# Patient Record
Sex: Female | Born: 1960 | Race: Black or African American | Hispanic: Refuse to answer | Marital: Single
Health system: Southern US, Community
[De-identification: ages and names within clinical notes are randomized; demographics above are authoritative.]

## PROBLEM LIST (undated history)

## (undated) HISTORY — PX: BREAST EXCISIONAL BIOPSY: SUR124

## (undated) HISTORY — PX: BREAST CYST ASPIRATION: SHX578

---

## 2019-05-28 ENCOUNTER — Other Ambulatory Visit: Payer: Self-pay | Admitting: Family Medicine

## 2019-05-28 ENCOUNTER — Other Ambulatory Visit: Payer: Self-pay

## 2019-05-28 ENCOUNTER — Ambulatory Visit
Admission: RE | Admit: 2019-05-28 | Discharge: 2019-05-28 | Disposition: A | Discharge status: Home / Self Care | Payer: Commercial Managed Care - PPO | Source: Ambulatory Visit | Attending: Family Medicine | Admitting: Family Medicine

## 2019-05-28 DIAGNOSIS — Z1231 Encounter for screening mammogram for malignant neoplasm of breast: Secondary | ICD-10-CM

## 2019-06-20 ENCOUNTER — Other Ambulatory Visit: Payer: Self-pay | Admitting: Family Medicine

## 2019-06-20 DIAGNOSIS — R928 Other abnormal and inconclusive findings on diagnostic imaging of breast: Secondary | ICD-10-CM

## 2019-07-11 ENCOUNTER — Other Ambulatory Visit: Payer: Self-pay

## 2019-07-11 ENCOUNTER — Ambulatory Visit
Admission: RE | Admit: 2019-07-11 | Discharge: 2019-07-11 | Disposition: A | Discharge status: Home / Self Care | Payer: Commercial Managed Care - PPO | Source: Ambulatory Visit | Attending: Family Medicine | Admitting: Family Medicine

## 2019-07-11 DIAGNOSIS — R928 Other abnormal and inconclusive findings on diagnostic imaging of breast: Secondary | ICD-10-CM

## 2020-10-03 ENCOUNTER — Ambulatory Visit: Payer: Commercial Managed Care - PPO | Attending: Internal Medicine

## 2020-10-03 DIAGNOSIS — Z23 Encounter for immunization: Secondary | ICD-10-CM

## 2020-10-03 NOTE — Progress Notes (Signed)
° °  Covid-19 Vaccination Clinic  Name:  Pressley Barsky    MRN: [id number] DOB: [birth year]  10/03/2020  Ms. Gervin was observed post Covid-19 immunization for 30 minutes based on pre-vaccination screening without incident. She was provided with Vaccine Information Sheet and instruction to access the V-Safe system.   Ms. Capurro was instructed to call 911 with any severe reactions post vaccine:  Difficulty breathing   Swelling of face and throat   A fast heartbeat   A bad rash all over body   Dizziness and weakness   Immunizations Administered    Name Date Dose VIS Date Route   Moderna COVID-19 Vaccine 10/03/2020  1:47 PM 0.5 mL 08/20/2020 Intramuscular   Manufacturer: Moderna   Lot: [id number]   NDC: 29518-841-66

## 2020-11-07 ENCOUNTER — Ambulatory Visit: Payer: Commercial Managed Care - PPO

## 2020-11-21 ENCOUNTER — Other Ambulatory Visit: Payer: Self-pay | Admitting: Family Medicine

## 2020-11-21 DIAGNOSIS — N63 Unspecified lump in unspecified breast: Secondary | ICD-10-CM

## 2020-11-21 DIAGNOSIS — N6019 Diffuse cystic mastopathy of unspecified breast: Secondary | ICD-10-CM

## 2020-12-12 ENCOUNTER — Other Ambulatory Visit: Payer: Self-pay | Admitting: Family Medicine

## 2020-12-12 DIAGNOSIS — Z1231 Encounter for screening mammogram for malignant neoplasm of breast: Secondary | ICD-10-CM

## 2020-12-15 ENCOUNTER — Other Ambulatory Visit: Payer: Self-pay

## 2020-12-15 ENCOUNTER — Ambulatory Visit
Admission: RE | Admit: 2020-12-15 | Discharge: 2020-12-15 | Disposition: A | Discharge status: Home / Self Care | Payer: Commercial Managed Care - PPO | Source: Ambulatory Visit | Attending: Family Medicine | Admitting: Family Medicine

## 2020-12-15 DIAGNOSIS — Z1231 Encounter for screening mammogram for malignant neoplasm of breast: Secondary | ICD-10-CM

## 2021-02-02 ENCOUNTER — Ambulatory Visit: Payer: Commercial Managed Care - PPO

## 2021-06-23 ENCOUNTER — Other Ambulatory Visit: Payer: Self-pay | Admitting: Family Medicine

## 2021-06-23 DIAGNOSIS — R2241 Localized swelling, mass and lump, right lower limb: Secondary | ICD-10-CM

## 2021-06-23 DIAGNOSIS — R222 Localized swelling, mass and lump, trunk: Secondary | ICD-10-CM

## 2021-06-29 ENCOUNTER — Other Ambulatory Visit: Payer: Commercial Managed Care - PPO

## 2021-07-13 ENCOUNTER — Ambulatory Visit
Admission: RE | Admit: 2021-07-13 | Discharge: 2021-07-13 | Disposition: A | Discharge status: Home / Self Care | Payer: Commercial Managed Care - PPO | Source: Ambulatory Visit | Attending: Family Medicine | Admitting: Family Medicine

## 2021-07-13 DIAGNOSIS — R2241 Localized swelling, mass and lump, right lower limb: Secondary | ICD-10-CM

## 2021-07-13 DIAGNOSIS — R222 Localized swelling, mass and lump, trunk: Secondary | ICD-10-CM

## 2022-02-12 ENCOUNTER — Other Ambulatory Visit: Payer: Self-pay | Admitting: Family Medicine

## 2022-02-12 DIAGNOSIS — Z1231 Encounter for screening mammogram for malignant neoplasm of breast: Secondary | ICD-10-CM

## 2022-02-18 ENCOUNTER — Other Ambulatory Visit: Payer: Self-pay | Admitting: Family Medicine

## 2022-02-18 DIAGNOSIS — N6452 Nipple discharge: Secondary | ICD-10-CM

## 2022-02-18 DIAGNOSIS — N6012 Diffuse cystic mastopathy of left breast: Secondary | ICD-10-CM

## 2022-03-08 ENCOUNTER — Ambulatory Visit
Admission: RE | Admit: 2022-03-08 | Discharge: 2022-03-08 | Disposition: A | Discharge status: Home / Self Care | Payer: Commercial Managed Care - PPO | Source: Ambulatory Visit | Attending: Family Medicine | Admitting: Family Medicine

## 2022-03-08 DIAGNOSIS — N6452 Nipple discharge: Secondary | ICD-10-CM

## 2022-03-08 DIAGNOSIS — N6012 Diffuse cystic mastopathy of left breast: Secondary | ICD-10-CM

## 2023-12-24 IMAGING — MG DIGITAL DIAGNOSTIC BILAT W/ TOMO W/ CAD
5 of 10 series · 5 of 30 positions shown · non-contrast
Comparison: Previous exam(s).
COMPARISON: Previous exam(s).

Addendum:
CLINICAL DATA: Patient describes previous abscess drainage within
the inner periareolar LEFT breast. Patient complains of increasingly
foul smell at the site of this previous drainage. However, patient
denies new palpable lump or any current drainage.

EXAM:
DIGITAL DIAGNOSTIC BILATERAL MAMMOGRAM WITH TOMOSYNTHESIS AND CAD;
ULTRASOUND LEFT BREAST LIMITED
TECHNIQUE: Bilateral digital diagnostic mammography and breast tomosynthesis
was performed. The images were evaluated with computer-aided
detection.; Targeted ultrasound examination of the left breast was
performed.

[R MLO synth-2D]
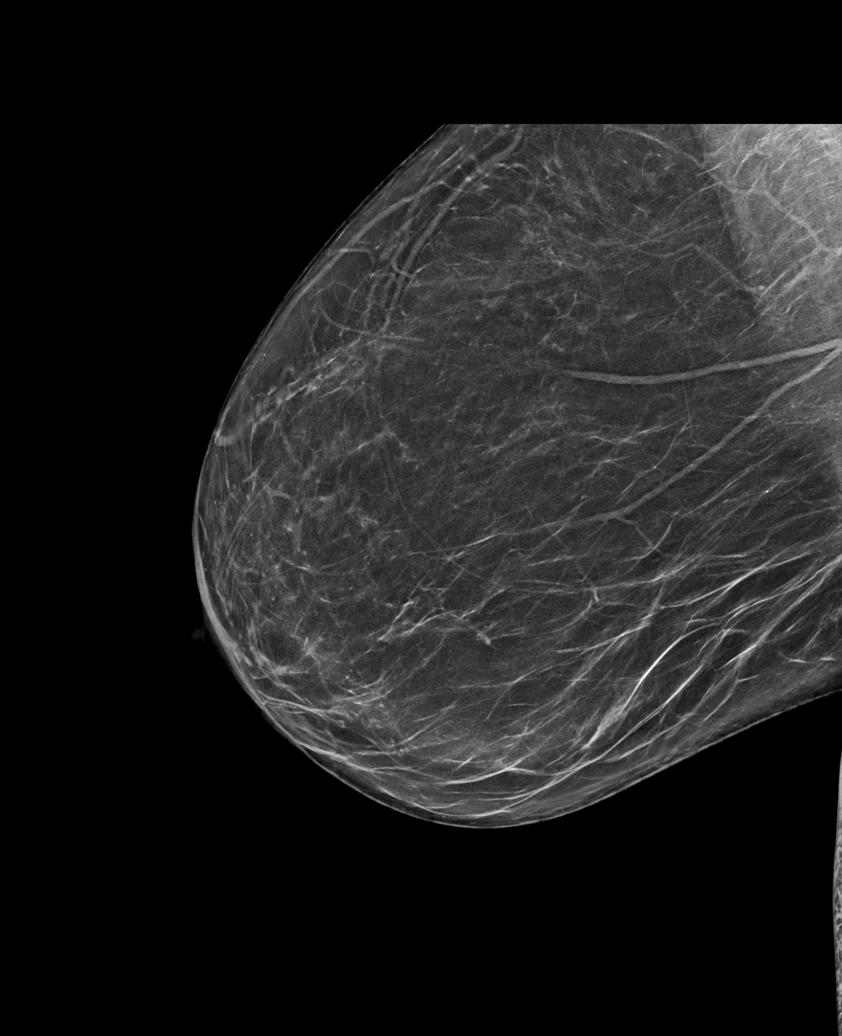

[L CC synth-2D]
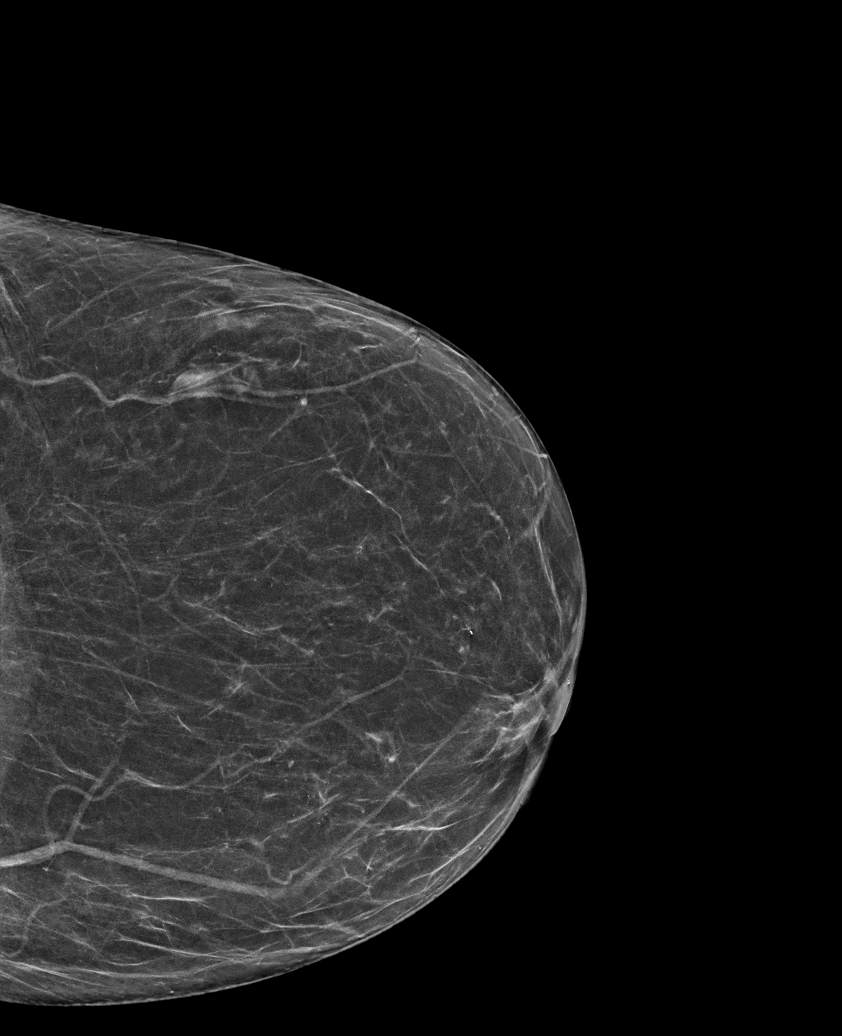

[L MLO synth-2D (1 of 2)]
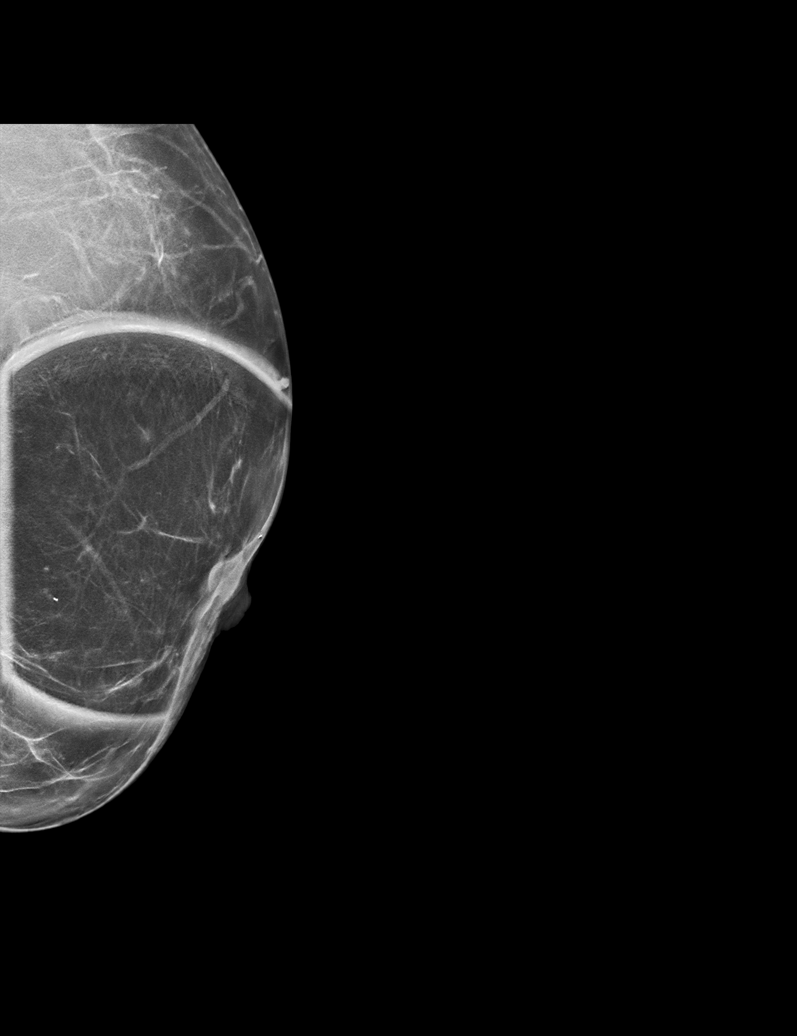

[R CC synth-2D]
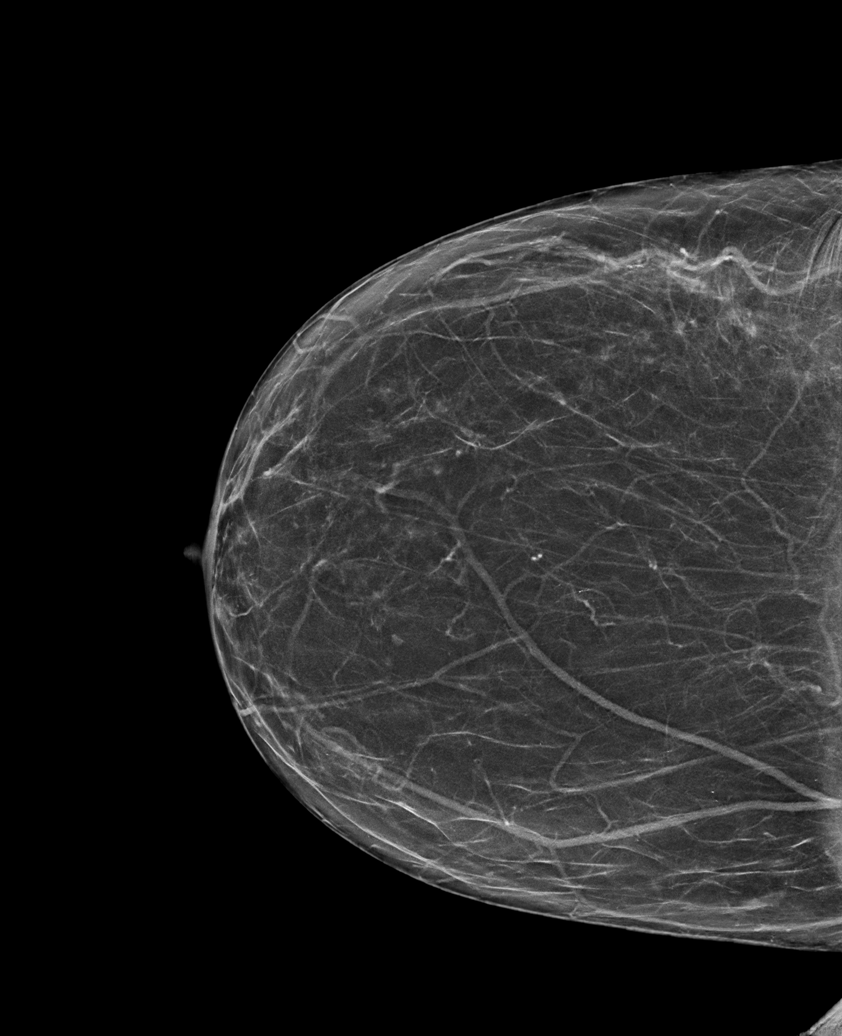

[L MLO synth-2D (2 of 2)]
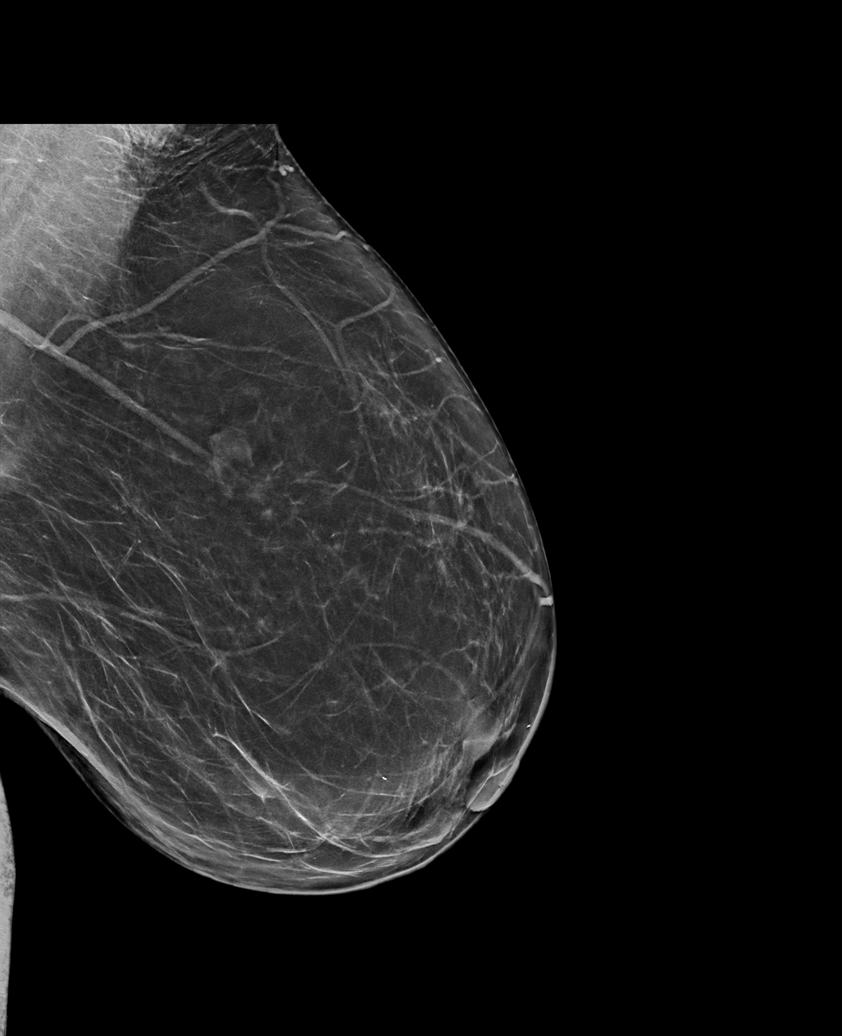

[5 of 30 positions shown; findings below may reference images not displayed]

ACR Breast Density Category b: There are scattered areas of
fibroglandular density.
FINDINGS: There is an oval dense mass within superficial soft tissues of the
upper inner periareolar LEFT breast, likely within the skin,
measuring approximately 1 cm greatest dimension, corresponding to
the palpable area of concern.

There are no new dominant masses, suspicious calcifications or
secondary signs of malignancy elsewhere within either breast.

On physical exam, there is a focal skin defect within the upper
inner periareolar LEFT breast which patient describes as a site of a
previous abscess drainage. Patient denies current drainage at this
site, however, patient describes increasing foul smell at this site.
Patient also denies any new palpable lump or thickening at this
site.

Targeted ultrasound is performed, evaluating the area of concern as
directed by the patient, showing a hypoechoic collection within the
skin of the LEFT breast at the 10 o'clock axis, 2 cm from the
nipple, measuring 1.7 x 0.5 x 0.9 cm, without internal vascularity.
No abscess collection is seen deep to the skin. No evidence of
malignancy is seen within the underlying breast tissues.
IMPRESSION: 1. Patient complains of a foul smell at the site of a previous
abscess drainage (upper inner periareolar LEFT breast). Patient
denies current drainage. Patient denies any new palpable lump or
thickening at this site. There is a focal skin defect within the
upper inner periareolar LEFT breast which patient states is the site
of this previous abscess drainage entrance site. There is no
associated skin redness or warmth at this site.
2. Hypoechoic collection within the skin of the LEFT breast at the
10 o'clock axis, 2 cm from the nipple, measuring 1.7 cm greatest
dimension. By appearance, this collection is of uncertain age,
however, this collection is presumably a chronic phlegmonous
thickening as patient states that the palpable findings are stable
for years and given the absence of any current skin redness or
drainage.
3. No evidence of malignancy within either breast.

RECOMMENDATION:
1. Patient requests a breast surgeon consultation given the
increasingly foul smell at the site of the previous abscess
drainage. [REDACTED] will be made aware of the
patient's request.
2. Annual screening mammograms.

I have discussed the findings and recommendations with the patient.
If applicable, a reminder letter will be sent to the patient
regarding the next appointment.

BI-RADS CATEGORY  2: Benign.

ADDENDUM:
Per patient request, surgical consultation has been arranged with
Dr. Muhunga Ropeu at [REDACTED] on April 15, 2022.

Marcelian Auret RN on 03/10/2022

*** End of Addendum ***
ACR Breast Density Category b: There are scattered areas of
fibroglandular density.
FINDINGS: There is an oval dense mass within superficial soft tissues of the
upper inner periareolar LEFT breast, likely within the skin,
measuring approximately 1 cm greatest dimension, corresponding to
the palpable area of concern.

There are no new dominant masses, suspicious calcifications or
secondary signs of malignancy elsewhere within either breast.

On physical exam, there is a focal skin defect within the upper
inner periareolar LEFT breast which patient describes as a site of a
previous abscess drainage. Patient denies current drainage at this
site, however, patient describes increasing foul smell at this site.
Patient also denies any new palpable lump or thickening at this
site.

Targeted ultrasound is performed, evaluating the area of concern as
directed by the patient, showing a hypoechoic collection within the
skin of the LEFT breast at the 10 o'clock axis, 2 cm from the
nipple, measuring 1.7 x 0.5 x 0.9 cm, without internal vascularity.
No abscess collection is seen deep to the skin. No evidence of
malignancy is seen within the underlying breast tissues.
IMPRESSION: 1. Patient complains of a foul smell at the site of a previous
abscess drainage (upper inner periareolar LEFT breast). Patient
denies current drainage. Patient denies any new palpable lump or
thickening at this site. There is a focal skin defect within the
upper inner periareolar LEFT breast which patient states is the site
of this previous abscess drainage entrance site. There is no
associated skin redness or warmth at this site.
2. Hypoechoic collection within the skin of the LEFT breast at the
10 o'clock axis, 2 cm from the nipple, measuring 1.7 cm greatest
dimension. By appearance, this collection is of uncertain age,
however, this collection is presumably a chronic phlegmonous
thickening as patient states that the palpable findings are stable
for years and given the absence of any current skin redness or
drainage.
3. No evidence of malignancy within either breast.

RECOMMENDATION:
1. Patient requests a breast surgeon consultation given the
increasingly foul smell at the site of the previous abscess
drainage. [REDACTED] will be made aware of the
patient's request.
2. Annual screening mammograms.

I have discussed the findings and recommendations with the patient.
If applicable, a reminder letter will be sent to the patient
regarding the next appointment.

BI-RADS CATEGORY  2: Benign.
